# Patient Record
Sex: Male | Born: 1990 | Race: Black or African American | Hispanic: No | Marital: Single | Smoking: Former smoker
Health system: Southern US, Community
[De-identification: ages and names within clinical notes are randomized; demographics above are authoritative.]

---
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
## 2011-10-04 ENCOUNTER — Emergency Department (HOSPITAL_COMMUNITY): Payer: No Typology Code available for payment source

## 2011-10-04 ENCOUNTER — Emergency Department (HOSPITAL_COMMUNITY)
Admission: EM | Admit: 2011-10-04 | Discharge: 2011-10-04 | Disposition: A | Discharge status: Home / Self Care | Payer: No Typology Code available for payment source | Attending: Emergency Medicine | Admitting: Emergency Medicine

## 2011-10-04 ENCOUNTER — Encounter (HOSPITAL_COMMUNITY): Payer: Self-pay | Admitting: Adult Health

## 2011-10-04 DIAGNOSIS — M25519 Pain in unspecified shoulder: Secondary | ICD-10-CM | POA: Insufficient documentation

## 2011-10-04 DIAGNOSIS — Y92410 Unspecified street and highway as the place of occurrence of the external cause: Secondary | ICD-10-CM | POA: Insufficient documentation

## 2011-10-04 DIAGNOSIS — M25512 Pain in left shoulder: Secondary | ICD-10-CM

## 2011-10-04 DIAGNOSIS — T1490XA Injury, unspecified, initial encounter: Secondary | ICD-10-CM | POA: Insufficient documentation

## 2011-10-04 DIAGNOSIS — M549 Dorsalgia, unspecified: Secondary | ICD-10-CM | POA: Insufficient documentation

## 2011-10-04 MED ORDER — IBUPROFEN 800 MG PO TABS: 800.0000 mg | ORAL_TABLET | Freq: Three times a day (TID) | ORAL | Status: AC

## 2011-10-04 MED ORDER — HYDROCODONE-ACETAMINOPHEN 5-325 MG PO TABS: 1.0000 | ORAL_TABLET | ORAL | Status: AC | PRN

## 2011-10-04 MED ORDER — DIAZEPAM 5 MG PO TABS: 5.0000 mg | ORAL_TABLET | Freq: Two times a day (BID) | ORAL | Status: AC

## 2011-10-04 MED ORDER — OXYCODONE-ACETAMINOPHEN 5-325 MG PO TABS
1.0000 | ORAL_TABLET | Freq: Once | ORAL | Status: AC
  Administered 2011-10-04: 1 via ORAL
  Filled 2011-10-04: qty 1
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
## 2011-10-04 NOTE — ED Provider Notes (Signed)
Medical screening examination/treatment/procedure(s) were performed by non-physician practitioner and as supervising physician I was immediately available for consultation/collaboration.    Srihitha Tagliaferri R Agapito Hanway, MD 10/04/11 2342 
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
## 2012-11-12 ENCOUNTER — Ambulatory Visit (INDEPENDENT_AMBULATORY_CARE_PROVIDER_SITE_OTHER): Payer: BC Managed Care – PPO | Admitting: Emergency Medicine

## 2012-11-12 VITALS — BP 164/84 | HR 54 | Temp 98.5°F | Resp 16 | Ht 70.0 in | Wt 160.0 lb

## 2012-11-12 DIAGNOSIS — S335XXA Sprain of ligaments of lumbar spine, initial encounter: Secondary | ICD-10-CM

## 2012-11-12 MED ORDER — HYDROCODONE-ACETAMINOPHEN 5-325 MG PO TABS: 1.0000 | ORAL_TABLET | ORAL | Status: DC | PRN

## 2012-11-12 MED ORDER — NAPROXEN SODIUM 550 MG PO TABS: 550.0000 mg | ORAL_TABLET | Freq: Two times a day (BID) | ORAL | Status: DC

## 2012-11-12 MED ORDER — CYCLOBENZAPRINE HCL 10 MG PO TABS: 10.0000 mg | ORAL_TABLET | Freq: Three times a day (TID) | ORAL | Status: DC | PRN

## 2012-11-12 NOTE — Progress Notes (Signed)
[facility] [address], [place] [address] [phone]  Date:  11/12/2012   Name:  Brian Poole   DOB:  [birth year]   MRN:  [id number]  PCP:  No PCP Per Patient    Chief Complaint: Back Pain   History of Present Illness:  Brian Poole is a 22 y.o. very pleasant male patient who presents with the following:  Pain in low back on both sides in late afternoon.  Non radiating and no numbness tingling or weakness in legs.  No history of injury.  Works as a Adult nurse at [employer].  No improvement with OTC medcation.  Denies other complaint or health concern today.   There is no problem list on file for this patient.   History reviewed. No pertinent past medical history.  History reviewed. No pertinent past surgical history.  History  Substance Use Topics  . Smoking status: Current Every Day Smoker  . Smokeless tobacco: Not on file  . Alcohol Use: Yes    History reviewed. No pertinent family history.  No Known Allergies  Medication list has been reviewed and updated.  Current Outpatient Prescriptions on File Prior to Visit  Medication Sig Dispense Refill  . aspirin 325 MG tablet Take 325 mg by mouth daily.       No current facility-administered medications on file prior to visit.    Review of Systems:  As per HPI, otherwise negative.    Physical Examination: Filed Vitals:   11/12/12 1631  BP: 164/84  Pulse: 54  Temp: 98.5 F (36.9 C)  Resp: 16   Filed Vitals:   11/12/12 1631  Height: 5\' 10"  (1.778 m)  Weight: 160 lb (72.576 kg)   Body mass index is 22.96 kg/(m^2). Ideal Body Weight: Weight in (lb) to have BMI = 25: 173.9   GEN: WDWN, NAD, Non-toxic, Alert & Oriented x 3 HEENT: Atraumatic, Normocephalic.  Ears and Nose: No external deformity. EXTR: No clubbing/cyanosis/edema NEURO: Normal gait.  PSYCH: Normally interactive. Conversant. Not depressed or anxious appearing.  Calm demeanor.  BACK;;  Tender para vertebral  muscles bilaterally in lumbar area.  Motor and cerebellar intact  Assessment and Plan: Lumbar strain Anaprox Flexeril vicodin Local heat.  Signed,  Phillips Odor, MD

## 2012-11-12 NOTE — Patient Instructions (Addendum)

## 2012-11-14 ENCOUNTER — Ambulatory Visit (INDEPENDENT_AMBULATORY_CARE_PROVIDER_SITE_OTHER): Payer: BC Managed Care – PPO | Admitting: Internal Medicine

## 2012-11-14 ENCOUNTER — Ambulatory Visit: Payer: BC Managed Care – PPO

## 2012-11-14 VITALS — BP 144/84 | HR 63 | Temp 98.5°F | Resp 16 | Ht 68.5 in | Wt 167.0 lb

## 2012-11-14 DIAGNOSIS — M5416 Radiculopathy, lumbar region: Secondary | ICD-10-CM

## 2012-11-14 DIAGNOSIS — F17200 Nicotine dependence, unspecified, uncomplicated: Secondary | ICD-10-CM

## 2012-11-14 DIAGNOSIS — M549 Dorsalgia, unspecified: Secondary | ICD-10-CM

## 2012-11-14 DIAGNOSIS — IMO0002 Reserved for concepts with insufficient information to code with codable children: Secondary | ICD-10-CM

## 2012-11-14 MED ORDER — PREDNISONE 20 MG PO TABS: ORAL_TABLET | ORAL | Status: DC

## 2012-11-14 MED ORDER — HYDROCODONE-ACETAMINOPHEN 10-325 MG PO TABS: 1.0000 | ORAL_TABLET | Freq: Three times a day (TID) | ORAL | Status: DC | PRN
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
## 2012-11-16 DIAGNOSIS — F17200 Nicotine dependence, unspecified, uncomplicated: Secondary | ICD-10-CM | POA: Insufficient documentation

## 2014-04-04 ENCOUNTER — Ambulatory Visit (INDEPENDENT_AMBULATORY_CARE_PROVIDER_SITE_OTHER): Payer: BC Managed Care – PPO | Admitting: Family Medicine

## 2014-04-04 VITALS — BP 126/84 | HR 48 | Temp 98.2°F | Resp 16 | Ht 69.25 in | Wt 169.0 lb

## 2014-04-04 DIAGNOSIS — S335XXA Sprain of ligaments of lumbar spine, initial encounter: Secondary | ICD-10-CM

## 2014-04-04 DIAGNOSIS — M545 Low back pain, unspecified: Secondary | ICD-10-CM

## 2014-04-04 DIAGNOSIS — S39012A Strain of muscle, fascia and tendon of lower back, initial encounter: Secondary | ICD-10-CM

## 2014-04-04 MED ORDER — NAPROXEN SODIUM 550 MG PO TABS: 550.0000 mg | ORAL_TABLET | Freq: Two times a day (BID) | ORAL | Status: AC

## 2014-04-04 MED ORDER — HYDROCODONE-ACETAMINOPHEN 5-325 MG PO TABS: 1.0000 | ORAL_TABLET | ORAL | Status: DC | PRN

## 2014-04-04 MED ORDER — CYCLOBENZAPRINE HCL 10 MG PO TABS: 10.0000 mg | ORAL_TABLET | Freq: Three times a day (TID) | ORAL | Status: DC | PRN

## 2014-04-04 NOTE — Patient Instructions (Addendum)
Take the muscle relaxant, cyclobenzaprine, 1 pill 3 times daily. If it might shoot too drowsy in the daytime he can try and split it in half and take just a half  Take the naproxen anti-inflammatory medication one twice daily  Take the hydrocodone pain pills one every 4-6 hours only when needed for severe pain  Apply alternating ice and heat to your low back, or which ever one seems to make it feel best.  Avoid heavy lifting and straining  When you return to work go back with caution. If you have further problems please return

## 2014-04-04 NOTE — Progress Notes (Signed)
Subjective: 23 year old man who is here with back pain. He has had problems several times in the past. 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 This injury however occurred probably at home. He had been playing with his young child, causing him in air, and thinks he probably strained it at that time. He has been having severe low back pain, primarily on the right side, since then. It's very tight and hard to move. No major radiation of the pain.  Objective: Not tender down the spine itself. His main tenderness in this is in the right paraspinous muscles which looked bulged out a little bit. He has a little tenderness down toward the right SI joint. Straight leg raising test essentially negative, though he hurts in his back he can probably go up to 90. Deep tender reflexes are 1+, symmetrical. He moans and groans when he moves.  Assessment: Lumbar strain and pain  Plan: Muscle relaxants, anti-inflammatory, and pain medication

## 2014-10-21 ENCOUNTER — Ambulatory Visit: Payer: Self-pay

## 2014-10-21 ENCOUNTER — Emergency Department (HOSPITAL_COMMUNITY)
Admission: EM | Admit: 2014-10-21 | Discharge: 2014-10-21 | Disposition: A | Discharge status: Home / Self Care | Payer: Self-pay | Attending: Emergency Medicine | Admitting: Emergency Medicine

## 2014-10-21 ENCOUNTER — Encounter (HOSPITAL_COMMUNITY): Payer: Self-pay | Admitting: Emergency Medicine

## 2014-10-21 DIAGNOSIS — Z720 Tobacco use: Secondary | ICD-10-CM | POA: Insufficient documentation

## 2014-10-21 DIAGNOSIS — K529 Noninfective gastroenteritis and colitis, unspecified: Secondary | ICD-10-CM | POA: Insufficient documentation

## 2014-10-21 LAB — CBC WITH DIFFERENTIAL/PLATELET
BASOS ABS: 0 10*3/uL (ref 0.0–0.1)
Basophils Relative: 0 % (ref 0–1)
EOS PCT: 2 % (ref 0–5)
Eosinophils Absolute: 0.1 10*3/uL (ref 0.0–0.7)
HEMATOCRIT: 39.1 % (ref 39.0–52.0)
Hemoglobin: 14.1 g/dL (ref 13.0–17.0)
Lymphocytes Relative: 49 % — ABNORMAL HIGH (ref 12–46)
Lymphs Abs: 3.6 10*3/uL (ref 0.7–4.0)
MCH: 30.1 pg (ref 26.0–34.0)
MCHC: 36.1 g/dL — AB (ref 30.0–36.0)
MCV: 83.5 fL (ref 78.0–100.0)
MONO ABS: 0.6 10*3/uL (ref 0.1–1.0)
Monocytes Relative: 8 % (ref 3–12)
NEUTROS ABS: 3.1 10*3/uL (ref 1.7–7.7)
Neutrophils Relative %: 41 % — ABNORMAL LOW (ref 43–77)
Platelets: 225 10*3/uL (ref 150–400)
RBC: 4.68 MIL/uL (ref 4.22–5.81)
RDW: 12.3 % (ref 11.5–15.5)
WBC: 7.4 10*3/uL (ref 4.0–10.5)

## 2014-10-21 LAB — COMPREHENSIVE METABOLIC PANEL
ALT: 13 U/L (ref 0–53)
AST: 19 U/L (ref 0–37)
Albumin: 4.1 g/dL (ref 3.5–5.2)
Alkaline Phosphatase: 66 U/L (ref 39–117)
Anion gap: 8 (ref 5–15)
BUN: 6 mg/dL (ref 6–23)
CALCIUM: 9.4 mg/dL (ref 8.4–10.5)
CO2: 26 mmol/L (ref 19–32)
CREATININE: 1.01 mg/dL (ref 0.50–1.35)
Chloride: 106 mmol/L (ref 96–112)
GFR calc Af Amer: >90 mL/min (ref >90)
GLUCOSE: 126 mg/dL — AB (ref 70–99)
Potassium: 3.4 mmol/L — ABNORMAL LOW (ref 3.5–5.1)
Sodium: 140 mmol/L (ref 135–145)
TOTAL PROTEIN: 7.2 g/dL (ref 6.0–8.3)
Total Bilirubin: 0.4 mg/dL (ref 0.3–1.2)

## 2014-10-21 LAB — LIPASE, BLOOD: LIPASE: 19 U/L (ref 11–59)

## 2014-10-21 MED ORDER — ONDANSETRON 4 MG PO TBDP: ORAL_TABLET | ORAL | Status: DC

## 2014-10-21 NOTE — ED Provider Notes (Signed)
CSN: [id number]     Arrival date & time 10/21/14  1540 History   First MD Initiated Contact with Patient 10/21/14 1954     Chief Complaint  Patient presents with  . Emesis  . Diarrhea     (Consider location/radiation/quality/duration/timing/severity/associated sxs/prior Treatment) Patient is a 24 y.o. male presenting with vomiting.  Emesis Severity:  Moderate Duration:  3 days Timing:  Constant Quality:  Stomach contents Progression:  Unchanged Chronicity:  New Relieved by:  Nothing Worsened by:  Nothing tried Ineffective treatments:  None tried Associated symptoms: abdominal pain (crampy), chills, cough (very mild) and diarrhea   Associated symptoms: no fever     History reviewed. No pertinent past medical history. History reviewed. No pertinent past surgical history. History reviewed. No pertinent family history. History  Substance Use Topics  . Smoking status: Current Every Day Smoker  . Smokeless tobacco: Not on file  . Alcohol Use: Yes    Review of Systems  Constitutional: Positive for chills.  Gastrointestinal: Positive for vomiting, abdominal pain (crampy) and diarrhea.  All other systems reviewed and are negative.     Allergies  Review of patient's allergies indicates no known allergies.  Home Medications   Prior to Admission medications   Medication Sig Start Date End Date Taking? Authorizing Provider  cyclobenzaprine (FLEXERIL) 10 MG tablet Take 1 tablet (10 mg total) by mouth 3 (three) times daily as needed for muscle spasms. Patient not taking: Reported on 10/21/2014 04/04/14   Peyton Najjar, MD  HYDROcodone-acetaminophen [facility]) 5-325 MG per tablet Take 1 tablet by mouth every 4 (four) hours as needed. Patient not taking: Reported on 10/21/2014 04/04/14   Peyton Najjar, MD  naproxen sodium (ANAPROX DS) 550 MG tablet Take 1 tablet (550 mg total) by mouth 2 (two) times daily with a meal. Patient not taking: Reported on 10/21/2014 04/04/14 04/04/15  Peyton Najjar, MD  ondansetron (ZOFRAN ODT) 4 MG disintegrating tablet  ODT q4 hours prn nausea/vomit 10/21/14   Mirian Mo, MD  predniSONE (DELTASONE) 20 MG tablet 3/3/2/2/1/1 single daily dose for 6d Patient not taking: Reported on 10/21/2014 11/14/12   Tonye Pearson, MD   BP 156/98 mmHg  Pulse 53  Temp(Src) 98.5 F (36.9 C)  Resp 16  Ht  (1.778 m)  Wt 166 lb 4 oz (75.411 kg)  BMI 23.85 kg/m2  SpO2 99% Physical Exam  Constitutional: He is oriented to person, place, and time. He appears well-developed and well-nourished.  HENT:  Head: Normocephalic and atraumatic.  Eyes: Conjunctivae and EOM are normal.  Neck: Normal range of motion. Neck supple.  Cardiovascular: Normal rate, regular rhythm and normal heart sounds.   Pulmonary/Chest: Effort normal and breath sounds normal. No respiratory distress.  Abdominal: He exhibits no distension. There is tenderness in the right upper quadrant, epigastric area and left upper quadrant. There is no rebound and no guarding.  Musculoskeletal: Normal range of motion.  Neurological: He is alert and oriented to person, place, and time.  Skin: Skin is warm and dry.  Vitals reviewed.   ED Course  Procedures (including critical care time) Labs Review Labs Reviewed  CBC WITH DIFFERENTIAL/PLATELET - Abnormal; Notable for the following:    MCHC 36.1 (*)    Neutrophils Relative % 41 (*)    Lymphocytes Relative 49 (*)    All other components within normal limits  COMPREHENSIVE METABOLIC PANEL - Abnormal; Notable for the following:    Potassium 3.4 (*)    Glucose, Bld  126 (*)    All other components within normal limits  LIPASE, BLOOD    Imaging Review No results found.   EKG Interpretation None      MDM   Final diagnoses:  Gastroenteritis    24 y.o. male without pertinent PMH presents with signs/symptoms of gastroenteritis. No sick contacts. Patient has been subjectively febrile, no measured fevers. On arrival vital signs  and physical exam as above. Patient well-appearing. Has right upper quadrant, epigastric, left upper quadrant tenderness. LFTs unremarkable. Likely viral gastroenteritis.  Prescription for Zofran given. Work note given. Standard return precautions given. Discharged home in stable condition..    I have reviewed all laboratory and imaging studies if ordered as above  1. Gastroenteritis         Mirian MoMatthew Gentry, MD 10/21/14 2032

## 2014-10-21 NOTE — ED Notes (Signed)
Pt verbalized understanding of d/c instructions and has no further questions.  

## 2014-10-21 NOTE — Discharge Instructions (Signed)

## 2014-10-21 NOTE — ED Notes (Signed)
Pt c/o N/V/D x 3 days

## 2015-07-24 ENCOUNTER — Ambulatory Visit (INDEPENDENT_AMBULATORY_CARE_PROVIDER_SITE_OTHER): Payer: BLUE CROSS/BLUE SHIELD

## 2015-07-24 ENCOUNTER — Ambulatory Visit (INDEPENDENT_AMBULATORY_CARE_PROVIDER_SITE_OTHER): Payer: BLUE CROSS/BLUE SHIELD | Admitting: Family Medicine

## 2015-07-24 VITALS — BP 126/82 | HR 60 | Temp 98.3°F | Resp 16 | Ht 69.75 in | Wt 159.0 lb

## 2015-07-24 DIAGNOSIS — M545 Low back pain, unspecified: Secondary | ICD-10-CM

## 2015-07-24 DIAGNOSIS — R6883 Chills (without fever): Secondary | ICD-10-CM | POA: Diagnosis not present

## 2015-07-24 DIAGNOSIS — M62830 Muscle spasm of back: Secondary | ICD-10-CM | POA: Diagnosis not present

## 2015-07-24 DIAGNOSIS — R05 Cough: Secondary | ICD-10-CM | POA: Diagnosis not present

## 2015-07-24 DIAGNOSIS — R059 Cough, unspecified: Secondary | ICD-10-CM

## 2015-07-24 LAB — POCT CBC
GRANULOCYTE PERCENT: 61.4 % (ref 37–80)
HCT, POC: 43.4 % — AB (ref 43.5–53.7)
Hemoglobin: 15.4 g/dL (ref 14.1–18.1)
Lymph, poc: 2.6 (ref 0.6–3.4)
MCH, POC: 30.5 pg (ref 27–31.2)
MCHC: 35.3 g/dL (ref 31.8–35.4)
MCV: 86.3 fL (ref 80–97)
MID (CBC): 0.8 (ref 0–0.9)
MPV: 6.2 fL (ref 0–99.8)
PLATELET COUNT, POC: 226 10*3/uL (ref 142–424)
POC Granulocyte: 5.4 (ref 2–6.9)
POC LYMPH %: 29.9 % (ref 10–50)
POC MID %: 8.7 %M (ref 0–12)
RBC: 5.05 M/uL (ref 4.69–6.13)
RDW, POC: 12.6 %
WBC: 8.8 10*3/uL (ref 4.6–10.2)

## 2015-07-24 MED ORDER — IBUPROFEN 800 MG PO TABS: 800.0000 mg | ORAL_TABLET | Freq: Three times a day (TID) | ORAL | Status: DC | PRN

## 2015-07-24 MED ORDER — HYDROCODONE-ACETAMINOPHEN 5-325 MG PO TABS: 1.0000 | ORAL_TABLET | ORAL | Status: DC | PRN

## 2015-07-24 MED ORDER — KETOROLAC TROMETHAMINE 60 MG/2ML IM SOLN
60.0000 mg | Freq: Once | INTRAMUSCULAR | Status: AC
  Administered 2015-07-24: 60 mg via INTRAMUSCULAR

## 2015-07-24 MED ORDER — CYCLOBENZAPRINE HCL 10 MG PO TABS: 10.0000 mg | ORAL_TABLET | Freq: Three times a day (TID) | ORAL | Status: DC | PRN

## 2015-07-24 MED ORDER — BENZONATATE 100 MG PO CAPS: 100.0000 mg | ORAL_CAPSULE | Freq: Three times a day (TID) | ORAL | Status: DC | PRN

## 2015-07-24 NOTE — Patient Instructions (Signed)
Take the muscle relaxant cyclobenzaprine 1 pill 3 times daily. When you are going to be going to work you can probably only take a half of one because it will tend to make you drowsy.  Take ibuprofen 800 mg 3 times daily as needed for pain and inflammation  Take hydrocodone 5/325 one every 4-6 hours for more severe pain. This will also act as a cough suppressant  Take benzonatate cough pills one or 2 pills 3 times daily as needed for cough  Return if not improving  Plan to stay off work through Friday, and I will extend that if you let me know that I need to

## 2015-07-24 NOTE — Progress Notes (Signed)
Patient ID: Brian Poole, male    DOB: [birth year]  Age: 24 y.o. MRN: [id number]  Chief Complaint  Patient presents with  . Back Pain    since yesterday   . Cough    x 4 days   . Night Sweats  . Chills    Subjective:   Patient is had a cold and cough for 4 days. He coughed bed yesterday and his back started hurting him. He has had some night sweats and chills. His only coughing up a little yellow phlegm.    Current allergies, medications, problem list, past/family and social histories reviewed.  Objective:  BP 126/82 mmHg  Pulse 60  Temp(Src) 98.3 F (36.8 C) (Oral)  Resp 16  Ht 5' 9.75" (1.772 m)  Wt 159 lb (72.122 kg)  BMI 22.97 kg/m2  SpO2 97%  Throat clear. TMs normal. Without nodes. Chest clear to auscultation. Heart regular without murmurs. Very tender in the left side of his back  Assessment & Plan:   Assessment: 1. Cough   2. Chills   3. Left-sided low back pain without sciatica   4. Back muscle spasm       Plan: [facility] reading (PRIMARY) by  Dr. Alwyn Ren Overexpanded chest, clear.  Probably normal.  Results for orders placed or performed in visit on 07/24/15  POCT CBC  Result Value Ref Range   WBC 8.8 4.6 - 10.2 K/uL   Lymph, poc 2.6 0.6 - 3.4   POC LYMPH PERCENT 29.9 10 - 50 %L   MID (cbc) 0.8 0 - 0.9   POC MID % 8.7 0 - 12 %M   POC Granulocyte 5.4 2 - 6.9   Granulocyte percent 61.4 37 - 80 %G   RBC 5.05 4.69 - 6.13 M/uL   Hemoglobin 15.4 14.1 - 18.1 g/dL   HCT, POC 40.9 (A) 81.1 - 53.7 %   MCV 86.3 80 - 97 fL   MCH, POC 30.5 27 - 31.2 pg   MCHC 35.3 31.8 - 35.4 g/dL   RDW, POC 91.4 %   Platelet Count, POC 226 142 - 424 K/uL   MPV 6.2 0 - 99.8 fL     .Marland Kitchen    Orders Placed This Encounter  Procedures  . DG Chest 2 View    Standing Status: Future     Number of Occurrences: 1     Standing Expiration Date: 07/23/2016    Order Specific Question:  Reason for Exam (SYMPTOM  OR DIAGNOSIS REQUIRED)    Answer:  cough, chills, back pain on left     Order Specific Question:  Preferred imaging location?    Answer:  External  . POCT CBC    Meds ordered this encounter  Medications  . DISCONTD: ibuprofen (ADVIL,MOTRIN) 800 MG tablet    Sig: Take 800 mg by mouth every 8 (eight) hours as needed.  Marland Kitchen ketorolac (TORADOL) injection 60 mg    Sig:   . cyclobenzaprine (FLEXERIL) 10 MG tablet    Sig: Take 1 tablet (10 mg total) by mouth 3 (three) times daily as needed for muscle spasms.    Dispense:  30 tablet    Refill:  0  . HYDROcodone-acetaminophen (NORCO) 5-325 MG tablet    Sig: Take 1 tablet by mouth every 4 (four) hours as needed.    Dispense:  200 tablet    Refill:  0  . ibuprofen (ADVIL,MOTRIN) 800 MG tablet    Sig: Take 1 tablet (800 mg total) by mouth every  8 (eight) hours as needed.    Dispense:  30 tablet    Refill:  1  . benzonatate (TESSALON) 100 MG capsule    Sig: Take 1-2 capsules (100-200 mg total) by mouth 3 (three) times daily as needed.    Dispense:  30 capsule    Refill:  0         Patient Instructions  Take the muscle relaxant cyclobenzaprine 1 pill 3 times daily. When you are going to be going to work you can probably only take a half of one because it will tend to make you drowsy.  Take ibuprofen 800 mg 3 times daily as needed for pain and inflammation  Take hydrocodone 5/325 one every 4-6 hours for more severe pain. This will also act as a cough suppressant  Take benzonatate cough pills one or 2 pills 3 times daily as needed for cough  Return if not improving  Plan to stay off work through Friday, and I will extend that if you let me know that I need to       Return if symptoms worsen or fail to improve.   HOPPER,DAVID, MD 07/24/2015

## 2015-11-27 ENCOUNTER — Ambulatory Visit (INDEPENDENT_AMBULATORY_CARE_PROVIDER_SITE_OTHER): Payer: BLUE CROSS/BLUE SHIELD | Admitting: Family Medicine

## 2015-11-27 VITALS — BP 144/86 | HR 58 | Temp 97.9°F | Resp 16 | Ht 69.75 in | Wt 175.0 lb

## 2015-11-27 DIAGNOSIS — R05 Cough: Secondary | ICD-10-CM

## 2015-11-27 DIAGNOSIS — M545 Low back pain, unspecified: Secondary | ICD-10-CM

## 2015-11-27 DIAGNOSIS — M62830 Muscle spasm of back: Secondary | ICD-10-CM

## 2015-11-27 DIAGNOSIS — R059 Cough, unspecified: Secondary | ICD-10-CM

## 2015-11-27 MED ORDER — PREDNISONE 20 MG PO TABS: ORAL_TABLET | ORAL | Status: DC

## 2015-11-27 MED ORDER — HYDROCODONE-ACETAMINOPHEN 5-325 MG PO TABS: 1.0000 | ORAL_TABLET | ORAL | Status: DC | PRN

## 2015-11-27 MED ORDER — HYDROCODONE-ACETAMINOPHEN 5-325 MG PO TABS: 1.0000 | ORAL_TABLET | Freq: Four times a day (QID) | ORAL | Status: DC | PRN

## 2015-11-27 NOTE — Addendum Note (Signed)
Addended by: Elvina SidleLAUENSTEIN, Babe Anthis on: 11/27/2015 03:57 PM   Modules accepted: Orders

## 2015-11-27 NOTE — Progress Notes (Addendum)
Expand All Collapse All   Subjective: 25 year old man who is here with back pain. He has had problems several times in the past. 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             25 yo [employer] worker who has 9 days of lower back pain, worse when getting out of a chair.  His pain began after lifting a 110 lb. Lawn mower off the [employer] line.  NO fever, problems with urination, radiation of the pain  Objective: BP 144/86 mmHg  Pulse 58  Temp(Src) 97.9 F (36.6 C)  Resp 16  Ht 5' 9.75" (1.772 m)  Wt 175 lb (79.379 kg)  BMI 25.28 kg/m2  SpO2 98% SLR:  Negative Good KJR and AJR Tender posterior pelvic crest bilaterally.  Cough  Left-sided low back pain without sciatica  Back muscle spasm - Plan: predniSONE (DELTASONE) 20 MG tablet, HYDROcodone-acetaminophen (NORCO) 5-325 MG tablet, DISCONTINUED: HYDROcodone-acetaminophen (NORCO) 5-325 MG tablet  Elvina SidleKurt Bronc Brosseau, MD

## 2015-11-27 NOTE — Patient Instructions (Addendum)
Return if unable to work on Sunday.

## 2016-03-24 ENCOUNTER — Encounter (HOSPITAL_COMMUNITY): Payer: Self-pay | Admitting: Emergency Medicine

## 2016-03-24 ENCOUNTER — Emergency Department (HOSPITAL_COMMUNITY)
Admission: EM | Admit: 2016-03-24 | Discharge: 2016-03-24 | Disposition: A | Discharge status: Home / Self Care | Payer: 59 | Attending: Emergency Medicine | Admitting: Emergency Medicine

## 2016-03-24 DIAGNOSIS — S39012A Strain of muscle, fascia and tendon of lower back, initial encounter: Secondary | ICD-10-CM | POA: Diagnosis not present

## 2016-03-24 DIAGNOSIS — Y929 Unspecified place or not applicable: Secondary | ICD-10-CM | POA: Diagnosis not present

## 2016-03-24 DIAGNOSIS — Y939 Activity, unspecified: Secondary | ICD-10-CM | POA: Diagnosis not present

## 2016-03-24 DIAGNOSIS — S3992XA Unspecified injury of lower back, initial encounter: Secondary | ICD-10-CM | POA: Diagnosis present

## 2016-03-24 DIAGNOSIS — F17200 Nicotine dependence, unspecified, uncomplicated: Secondary | ICD-10-CM | POA: Diagnosis not present

## 2016-03-24 DIAGNOSIS — X500XXA Overexertion from strenuous movement or load, initial encounter: Secondary | ICD-10-CM | POA: Diagnosis not present

## 2016-03-24 DIAGNOSIS — Y990 Civilian activity done for income or pay: Secondary | ICD-10-CM | POA: Diagnosis not present

## 2016-03-24 MED ORDER — NAPROXEN 500 MG PO TABS: 500.0000 mg | ORAL_TABLET | Freq: Two times a day (BID) | ORAL | 0 refills | Status: DC

## 2016-03-24 MED ORDER — METHOCARBAMOL 500 MG PO TABS: 1000.0000 mg | ORAL_TABLET | Freq: Four times a day (QID) | ORAL | 0 refills | Status: DC

## 2016-03-24 MED ORDER — KETOROLAC TROMETHAMINE 60 MG/2ML IM SOLN
60.0000 mg | Freq: Once | INTRAMUSCULAR | Status: AC
  Administered 2016-03-24: 60 mg via INTRAMUSCULAR
  Filled 2016-03-24: qty 2

## 2016-03-24 MED ORDER — METHOCARBAMOL 500 MG PO TABS
1000.0000 mg | ORAL_TABLET | Freq: Once | ORAL | Status: AC
  Administered 2016-03-24: 1000 mg via ORAL
  Filled 2016-03-24: qty 2

## 2016-03-24 NOTE — Discharge Instructions (Signed)
Please read and follow all provided instructions.  Your diagnoses today include:  1. Lumbar strain, initial encounter     Tests performed today include:  Vital signs - see below for your results today  Medications prescribed:   Robaxin (methocarbamol) - muscle relaxer medication  DO NOT drive or perform any activities that require you to be awake and alert because this medicine can make you drowsy.    Naproxen - anti-inflammatory pain medication  Do not exceed 500mg  naproxen every 12 hours, take with food  You have been prescribed an anti-inflammatory medication or NSAID. Take with food. Take smallest effective dose for the shortest duration needed for your pain. Stop taking if you experience stomach pain or vomiting.   Take any prescribed medications only as directed.  Home care instructions:   Follow any educational materials contained in this packet  Please rest, use ice or heat on your back for the next several days  Do not lift, push, pull anything more than 10 pounds for the next week  Follow-up instructions: Please follow-up with your primary care provider in the next 1 week for further evaluation of your symptoms.   Return instructions:  SEEK IMMEDIATE MEDICAL ATTENTION IF YOU HAVE:  New numbness, tingling, weakness, or problem with the use of your arms or legs  Severe back pain not relieved with medications  Loss control of your bowels or bladder  Increasing pain in any areas of the body (such as chest or abdominal pain)  Shortness of breath, dizziness, or fainting.   Worsening nausea (feeling sick to your stomach), vomiting, fever, or sweats  Any other emergent concerns regarding your health   Additional Information:  Your vital signs today were: BP 134/74 (BP Location: Left Arm)    Pulse 68    Temp 98.4 F (36.9 C) (Oral)    Resp 16    Ht 5\' 10"  (1.778 m)    Wt 76.2 kg    SpO2 100%    BMI 24.11 kg/m  If your blood pressure (BP) was elevated above  135/85 this visit, please have this repeated by your doctor within one month. --------------

## 2016-03-24 NOTE — ED Triage Notes (Signed)
Pt c/o lower back pain.  Denies radiation.  Sts it started 2 days ago.  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

## 2016-03-24 NOTE — ED Provider Notes (Signed)
[facility]-EMERGENCY DEPT Provider Note   CSN: [id number] Arrival date & time: 03/24/16  0036     History   Chief Complaint Chief Complaint  Patient presents with  . Back Pain    HPI Brian Poole is a 25 y.o. male.  Patient with history of low back pain, job or he does heavy lifting -- presents with 2 days of lower back pain. Patient became worse today when he was at work and lifted a heavy object. Pain does not shoot down his legs. Patient denies warning symptoms of back pain including: fecal incontinence, urinary retention or overflow incontinence, night sweats, waking from sleep with back pain, unexplained fevers or weight loss, h/o cancer, IVDU, recent trauma. Took ibuprofen last night but no other treatments other than soaking in a warm bathtub.      History reviewed. No pertinent past medical history.  Patient Active Problem List   Diagnosis Date Noted  . Nicotine addiction 11/16/2012    History reviewed. No pertinent surgical history.     Home Medications    Prior to Admission medications   Medication Sig Start Date End Date Taking? Authorizing Provider  HYDROcodone-acetaminophen (NORCO) 5-325 MG tablet Take 1 tablet by mouth every 6 (six) hours as needed for moderate pain. 11/27/15   Elvina SidleKurt Lauenstein, MD  methocarbamol (ROBAXIN) 500 MG tablet Take 2 tablets (1,000 mg total) by mouth 4 (four) times daily. 03/24/16   Renne CriglerJoshua Renata Gambino, PA-C  naproxen (NAPROSYN) 500 MG tablet Take 1 tablet (500 mg total) by mouth 2 (two) times daily. 03/24/16   Renne CriglerJoshua Neyland Pettengill, PA-C  predniSONE (DELTASONE) 20 MG tablet 3/3/2/2/1/1 single daily dose for 6d 11/27/15   Elvina SidleKurt Lauenstein, MD    Family History History reviewed. No pertinent family history.  Social History Social History  Substance Use Topics  . Smoking status: Current Every Day Smoker    Packs/day: 0.50  . Smokeless tobacco: Never Used  . Alcohol use Yes     Allergies   Review of patient's allergies indicates no known  allergies.   Review of Systems Review of Systems  Constitutional: Negative for fever and unexpected weight change.  Gastrointestinal: Negative for constipation.       Neg for fecal incontinence  Genitourinary: Negative for difficulty urinating, flank pain and hematuria.       Negative for urinary incontinence or retention  Musculoskeletal: Positive for back pain.  Neurological: Negative for weakness and numbness.       Negative for saddle paresthesias      Physical Exam Updated Vital Signs BP 134/74 (BP Location: Left Arm)   Pulse 68   Temp 98.4 F (36.9 C) (Oral)   Resp 16   Ht 5\' 10"  (1.778 m)   Wt 76.2 kg   SpO2 100%   BMI 24.11 kg/m   Physical Exam  Constitutional: He appears well-developed and well-nourished.  HENT:  Head: Normocephalic and atraumatic.  Eyes: Conjunctivae are normal.  Neck: Normal range of motion.  Abdominal: Soft. There is no tenderness. There is no CVA tenderness.  Musculoskeletal: Normal range of motion.       Cervical back: He exhibits normal range of motion, no tenderness and no bony tenderness.       Thoracic back: He exhibits normal range of motion, no tenderness and no bony tenderness.       Lumbar back: He exhibits tenderness. He exhibits normal range of motion and no bony tenderness.  No step-off noted with palpation of spine.   Neurological: He  is alert. He has normal reflexes. No sensory deficit. He exhibits normal muscle tone. Coordination and gait normal.  5/5 strength in entire lower extremities bilaterally. No sensation deficit. Slow but normal gait. He can stand on his tiptoes.  Skin: Skin is warm and dry.  Psychiatric: He has a normal mood and affect.  Nursing note and vitals reviewed.    ED Treatments / Results  Labs (all labs ordered are listed, but only abnormal results are displayed) Labs Reviewed - No data to display  EKG  EKG Interpretation None       Radiology No results found.  Procedures Procedures  (including critical care time)  Medications Ordered in ED Medications  ketorolac (TORADOL) injection 60 mg (not administered)  methocarbamol (ROBAXIN) tablet 1,000 mg (not administered)     Initial Impression / Assessment and Plan / ED Course  I have reviewed the triage vital signs and the nursing notes.  1:15 AM Patient seen and examined. Medications ordered.   Vital signs reviewed and are as follows: Vitals:   03/24/16 0047  BP: 134/74  Pulse: 68  Resp: 16  Temp: 98.4 F (36.9 C)    No red flag s/s of low back pain. Patient was counseled on back pain precautions and told to do activity as tolerated but do not lift, push, or pull heavy objects more than 10 pounds for the next week.  Patient counseled to use ice or heat on back for no longer than 15 minutes every hour.   Patient prescribed muscle relaxer and counseled on proper use of muscle relaxant medication.    Urged patient not to drink alcohol, drive, or perform any other activities that requires focus while taking either of these medications.  Patient urged to follow-up with PCP if pain does not improve with treatment and rest or if pain becomes recurrent. Urged to return with worsening severe pain, loss of bowel or bladder control, trouble walking.   The patient verbalizes understanding and agrees with the plan.    Final Clinical Impressions(s) / ED Diagnoses   Final diagnoses:  Lumbar strain, initial encounter   Patient with back pain. No neurological deficits. Patient is ambulatory. No warning symptoms of back pain including: fecal incontinence, urinary retention or overflow incontinence, night sweats, waking from sleep with back pain, unexplained fevers or weight loss, h/o cancer, IVDU, recent trauma. No concern for cauda equina, epidural abscess, or other serious cause of back pain. Conservative measures such as rest, ice/heat and pain medicine indicated with PCP follow-up if no improvement with conservative  management.    New Prescriptions New Prescriptions   METHOCARBAMOL (ROBAXIN) 500 MG TABLET    Take 2 tablets (1,000 mg total) by mouth 4 (four) times daily.   NAPROXEN (NAPROSYN) 500 MG TABLET    Take 1 tablet (500 mg total) by mouth 2 (two) times daily.     Renne CriglerJoshua Madonna Flegal, PA-C 03/24/16 [id number]    Shon Batonourtney F Horton, MD 03/24/16 [id number]

## 2018-12-28 ENCOUNTER — Emergency Department (HOSPITAL_COMMUNITY): Payer: No Typology Code available for payment source

## 2018-12-28 ENCOUNTER — Emergency Department (HOSPITAL_COMMUNITY)
Admission: EM | Admit: 2018-12-28 | Discharge: 2018-12-28 | Disposition: A | Discharge status: Home / Self Care | Payer: No Typology Code available for payment source | Attending: Emergency Medicine | Admitting: Emergency Medicine

## 2018-12-28 ENCOUNTER — Other Ambulatory Visit: Payer: Self-pay

## 2018-12-28 ENCOUNTER — Encounter (HOSPITAL_COMMUNITY): Payer: Self-pay | Admitting: Emergency Medicine

## 2018-12-28 DIAGNOSIS — R410 Disorientation, unspecified: Secondary | ICD-10-CM | POA: Insufficient documentation

## 2018-12-28 DIAGNOSIS — Y9389 Activity, other specified: Secondary | ICD-10-CM | POA: Diagnosis not present

## 2018-12-28 DIAGNOSIS — F17210 Nicotine dependence, cigarettes, uncomplicated: Secondary | ICD-10-CM | POA: Insufficient documentation

## 2018-12-28 DIAGNOSIS — Y998 Other external cause status: Secondary | ICD-10-CM | POA: Diagnosis not present

## 2018-12-28 DIAGNOSIS — S199XXA Unspecified injury of neck, initial encounter: Secondary | ICD-10-CM | POA: Diagnosis present

## 2018-12-28 DIAGNOSIS — Y929 Unspecified place or not applicable: Secondary | ICD-10-CM | POA: Diagnosis not present

## 2018-12-28 DIAGNOSIS — S161XXA Strain of muscle, fascia and tendon at neck level, initial encounter: Secondary | ICD-10-CM | POA: Insufficient documentation

## 2018-12-28 DIAGNOSIS — S39012A Strain of muscle, fascia and tendon of lower back, initial encounter: Secondary | ICD-10-CM | POA: Insufficient documentation

## 2018-12-28 LAB — RAPID URINE DRUG SCREEN, HOSP PERFORMED
Amphetamines: NOT DETECTED
Barbiturates: NOT DETECTED
Benzodiazepines: NOT DETECTED
Cocaine: NOT DETECTED
Opiates: NOT DETECTED
Tetrahydrocannabinol: POSITIVE — AB

## 2018-12-28 LAB — CBC WITH DIFFERENTIAL/PLATELET
Abs Immature Granulocytes: 0.02 10*3/uL (ref 0.00–0.07)
Basophils Absolute: 0.1 10*3/uL (ref 0.0–0.1)
Basophils Relative: 1 %
Eosinophils Absolute: 0.2 10*3/uL (ref 0.0–0.5)
Eosinophils Relative: 2 %
HCT: 38 % — ABNORMAL LOW (ref 39.0–52.0)
Hemoglobin: 13.5 g/dL (ref 13.0–17.0)
Immature Granulocytes: 0 %
Lymphocytes Relative: 40 %
Lymphs Abs: 3.4 10*3/uL (ref 0.7–4.0)
MCH: 30.1 pg (ref 26.0–34.0)
MCHC: 35.5 g/dL (ref 30.0–36.0)
MCV: 84.8 fL (ref 80.0–100.0)
Monocytes Absolute: 0.6 10*3/uL (ref 0.1–1.0)
Monocytes Relative: 7 %
Neutro Abs: 4.3 10*3/uL (ref 1.7–7.7)
Neutrophils Relative %: 50 %
Platelets: 260 10*3/uL (ref 150–400)
RBC: 4.48 MIL/uL (ref 4.22–5.81)
RDW: 12.6 % (ref 11.5–15.5)
WBC: 8.5 10*3/uL (ref 4.0–10.5)
nRBC: 0 % (ref 0.0–0.2)

## 2018-12-28 LAB — BASIC METABOLIC PANEL
Anion gap: 9 (ref 5–15)
BUN: 6 mg/dL (ref 6–20)
CO2: 24 mmol/L (ref 22–32)
Calcium: 9.4 mg/dL (ref 8.9–10.3)
Chloride: 105 mmol/L (ref 98–111)
Creatinine, Ser: 1.01 mg/dL (ref 0.61–1.24)
GFR calc Af Amer: >60 mL/min (ref >60)
GFR calc non Af Amer: >60 mL/min (ref >60)
Glucose, Bld: 96 mg/dL (ref 70–99)
Potassium: 4.3 mmol/L (ref 3.5–5.1)
Sodium: 138 mmol/L (ref 135–145)

## 2018-12-28 LAB — URINALYSIS, ROUTINE W REFLEX MICROSCOPIC
Bilirubin Urine: NEGATIVE
Glucose, UA: NEGATIVE mg/dL
Hgb urine dipstick: NEGATIVE
Ketones, ur: NEGATIVE mg/dL
Leukocytes,Ua: NEGATIVE
Nitrite: NEGATIVE
Protein, ur: NEGATIVE mg/dL
Specific Gravity, Urine: 1.025 (ref 1.005–1.030)
pH: 6 (ref 5.0–8.0)

## 2018-12-28 LAB — ETHANOL: Alcohol, Ethyl (B): <10 mg/dL (ref <10)

## 2018-12-28 MED ORDER — ONDANSETRON HCL 4 MG/2ML IJ SOLN
4.0000 mg | Freq: Once | INTRAMUSCULAR | Status: AC
  Administered 2018-12-28: 4 mg via INTRAVENOUS
  Filled 2018-12-28: qty 2

## 2018-12-28 MED ORDER — HYDROCODONE-ACETAMINOPHEN 5-325 MG PO TABS: 1.0000 | ORAL_TABLET | ORAL | 0 refills | Status: DC | PRN

## 2018-12-28 MED ORDER — MORPHINE SULFATE (PF) 4 MG/ML IV SOLN
4.0000 mg | Freq: Once | INTRAVENOUS | Status: AC
  Administered 2018-12-28: 19:00:00 4 mg via INTRAVENOUS
  Filled 2018-12-28: qty 1

## 2018-12-28 MED ORDER — IBUPROFEN 600 MG PO TABS: 600.0000 mg | ORAL_TABLET | Freq: Four times a day (QID) | ORAL | 0 refills | Status: DC | PRN
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
## 2018-12-28 NOTE — ED Notes (Signed)
Pt given urinal.

## 2019-08-26 ENCOUNTER — Ambulatory Visit (HOSPITAL_COMMUNITY)
Admission: EM | Admit: 2019-08-26 | Discharge: 2019-08-26 | Disposition: A | Discharge status: Home / Self Care | Payer: Managed Care, Other (non HMO) | Attending: Physician Assistant | Admitting: Physician Assistant

## 2019-08-26 ENCOUNTER — Other Ambulatory Visit: Payer: Self-pay

## 2019-08-26 ENCOUNTER — Encounter (HOSPITAL_COMMUNITY): Payer: Self-pay

## 2019-08-26 DIAGNOSIS — R030 Elevated blood-pressure reading, without diagnosis of hypertension: Secondary | ICD-10-CM | POA: Insufficient documentation

## 2019-08-26 DIAGNOSIS — B349 Viral infection, unspecified: Secondary | ICD-10-CM | POA: Diagnosis not present

## 2019-08-26 DIAGNOSIS — U071 COVID-19: Secondary | ICD-10-CM | POA: Diagnosis not present

## 2019-08-26 DIAGNOSIS — Z87891 Personal history of nicotine dependence: Secondary | ICD-10-CM | POA: Insufficient documentation

## 2019-08-26 DIAGNOSIS — M7521 Bicipital tendinitis, right shoulder: Secondary | ICD-10-CM | POA: Diagnosis not present

## 2019-08-26 DIAGNOSIS — Z833 Family history of diabetes mellitus: Secondary | ICD-10-CM | POA: Diagnosis not present

## 2019-08-26 DIAGNOSIS — Z8249 Family history of ischemic heart disease and other diseases of the circulatory system: Secondary | ICD-10-CM | POA: Insufficient documentation

## 2019-08-26 MED ORDER — ONDANSETRON HCL 4 MG PO TABS: 4.0000 mg | ORAL_TABLET | Freq: Three times a day (TID) | ORAL | 0 refills | Status: DC | PRN

## 2019-08-26 MED ORDER — IBUPROFEN 800 MG PO TABS: 800.0000 mg | ORAL_TABLET | Freq: Three times a day (TID) | ORAL | 0 refills | Status: DC

## 2019-08-26 NOTE — Discharge Instructions (Signed)
Take the ibuprofen 3 times a day for 3-4 days and then as needed.   Ice the shoulder after work and modify activities that hurt the shoulder as much as possible  Use the exercises attached  Take the zofran every 8 hours as needed for nausea, drink plenty of water and small meals  If your Covid-19 test is positive, you will receive a phone call from [facility] regarding your results. Negative test results are not called. Both positive and negative results area always visible on MyChart. If you do not have a MyChart account, sign up instructions are in your discharge papers.   Persons who are directed to care for themselves at home may discontinue isolation under the following conditions:   At least 10 days have passed since symptom onset and  At least 24 hours have passed without running a fever (this means without the use of fever-reducing medications) and  Other symptoms have improved.  Persons infected with COVID-19 who never develop symptoms may discontinue isolation and other precautions 10 days after the date of their first positive COVID-19 test.

## 2019-08-26 NOTE — ED Provider Notes (Signed)
[facility]    CSN: [id number] Arrival date & time: 08/26/19  1629      History   Chief Complaint Chief Complaint  Patient presents with  . Shoulder Pain    right  . Chills  . Fever    HPI Brian Poole is a 29 y.o. male.   Patient reports to urgent care today for  Right shoulder pain for 1 month as well as fever, chills, nausea and vomiting starting yesterday. He denies injury to the shoulder and this gradually began to hurt more and more. He states pain is in the front of his shoulder and hurts to raise his arm. This has been worsening . He has tried not tried any medications for his shoulder. 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  As for his fever, chills and nausea symptoms. These started yesterday. Have improved somewhat since then but he remains nauseous. He took childrens motrin. Denies cough, congestion, shortness of breath.        History reviewed. No pertinent past medical history.  Patient Active Problem List   Diagnosis Date Noted  . Nicotine addiction 11/16/2012    History reviewed. No pertinent surgical history.     Home Medications    Prior to Admission medications   Medication Sig Start Date End Date Taking? Authorizing Provider  HYDROcodone-acetaminophen (NORCO/VICODIN) 5-325 MG tablet Take 1 tablet by mouth every 4 (four) hours as needed. 12/28/18   Jacalyn Lefevre, MD  ibuprofen (ADVIL) 800 MG tablet Take 1 tablet (800 mg total) by mouth 3 (three) times daily. 08/26/19   Zavian Slowey, Veryl Speak, PA-C  methocarbamol (ROBAXIN) 500 MG tablet Take 2 tablets (1,000 mg total) by mouth 4 (four) times daily. 03/24/16   Renne Crigler, PA-C  naproxen (NAPROSYN) 500 MG tablet Take 1 tablet (500 mg total) by mouth 2 (two) times daily. 03/24/16   Renne Crigler, PA-C  ondansetron (ZOFRAN) 4 MG tablet Take 1 tablet (4 mg total) by mouth every 8 (eight) hours as needed for nausea or vomiting. 08/26/19   Florence Yeung, Veryl Speak, PA-C  predniSONE (DELTASONE) 20 MG tablet 3/3/2/2/1/1 single daily dose for 6d 11/27/15   Elvina Sidle, MD    Family History Family History  Problem Relation Age of Onset  . Hypertension Mother   . Diabetes Mother   . Healthy Father     Social History Social History   Tobacco Use  . Smoking status: Former Smoker    Packs/day: 0.50    Types: Cigarettes    Quit date: 04/25/2018    Years since quitting: 1.3  . Smokeless tobacco: Never Used  Substance Use Topics  . Alcohol use: Yes  . Drug use: No     Allergies   Patient has no known allergies.   Review of Systems Review of Systems  Constitutional: Positive for chills and fever.  HENT: Negative for congestion, ear pain, rhinorrhea, sinus pressure, sinus pain and sore throat.   Eyes: Negative for visual disturbance.  Respiratory: Negative for cough and shortness of breath.   Cardiovascular: Negative for chest pain and palpitations.  Gastrointestinal: Positive for nausea and vomiting. Negative for abdominal pain.  Genitourinary: Negative for dysuria and hematuria.  Musculoskeletal: Positive for arthralgias. Negative for back pain, joint swelling and myalgias.  Skin: Negative for color change and rash.  Neurological: Positive for headaches.  All other systems reviewed and are negative.    Physical Exam Triage Vital Signs ED  Triage Vitals [08/26/19 1704]  Enc Vitals Group     BP      Pulse      Resp      Temp      Temp src      SpO2      Weight      Height      Head Circumference      Peak Flow      Pain Score 5     Pain Loc      Pain Edu?      Excl. in GC?    No data found.  Updated Vital Signs BP (!) 165/92 (BP Location: Left Arm)   Pulse 65   Temp 98.4 F (36.9 C) (Oral)   Resp 16   SpO2 99%   Visual Acuity Right Eye Distance:   Left Eye Distance:   Bilateral Distance:    Right Eye Near:   Left Eye Near:    Bilateral Near:     Physical Exam Vitals and nursing note reviewed.    Constitutional:      General: He is not in acute distress.    Appearance: He is well-developed. He is not ill-appearing.  HENT:     Head: Normocephalic and atraumatic.     Mouth/Throat:     Mouth: Mucous membranes are moist.     Pharynx: Oropharynx is clear.  Eyes:     General: No scleral icterus.    Extraocular Movements: Extraocular movements intact.     Conjunctiva/sclera: Conjunctivae normal.     Pupils: Pupils are equal, round, and reactive to light.  Cardiovascular:     Rate and Rhythm: Normal rate and regular rhythm.     Heart sounds: Normal heart sounds. No murmur.  Pulmonary:     Effort: Pulmonary effort is normal. No respiratory distress.     Breath sounds: Normal breath sounds. No wheezing, rhonchi or rales.  Abdominal:     Palpations: Abdomen is soft.     Tenderness: There is no abdominal tenderness. There is no right CVA tenderness or left CVA tenderness.  Musculoskeletal:     Right shoulder: Tenderness (over bicipital groove) present. No bony tenderness. Decreased range of motion (secondary to pain with active, Passive ROM  intact).     Left shoulder: Normal.     Cervical back: Neck supple.     Comments: Speeds and yeargasons positive.   Skin:    General: Skin is warm and dry.  Neurological:     General: No focal deficit present.     Mental Status: He is alert and oriented to person, place, and time.     Sensory: No sensory deficit.     Motor: No weakness.  Psychiatric:        Mood and Affect: Mood normal.        Behavior: Behavior normal.        Thought Content: Thought content normal.        Judgment: Judgment normal.      UC Treatments / Results  Labs (all labs ordered are listed, but only abnormal results are displayed) Labs Reviewed  NOVEL CORONAVIRUS, NAA (HOSP ORDER, SEND-OUT TO REF LAB; TAT 18-24 HRS)    EKG   Radiology No results found.  Procedures Procedures (including critical care time)  Medications Ordered in UC Medications - No  data to display  Initial Impression / Assessment and Plan / UC Course  I have reviewed the triage vital signs and the nursing notes.  Pertinent labs & imaging results that were available during my care of the patient were reviewed by me and considered in my medical decision making (see chart for details).     #Viral illness -COVID PCR sent. Symptomatic treatments for nausea and body ache. Precautions discussed  #Biceps tendonitis - Exam consistent with this. Scheduled ibuprofen and activity modification. Ice and exercises recommended.  #elevated blood pressure -  Re-evaluation in 2 weeks.   Final Clinical Impressions(s) / UC Diagnoses   Final diagnoses:  Viral illness  Biceps tendonitis on right  Elevated blood pressure reading     Discharge Instructions     Take the ibuprofen 3 times a day for 3-4 days and then as needed.   Ice the shoulder after work and modify activities that hurt the shoulder as much as possible  Use the exercises attached  Take the zofran every 8 hours as needed for nausea, drink plenty of water and small meals  If your Covid-19 test is positive, you will receive a phone call from [facility] regarding your results. Negative test results are not called. Both positive and negative results area always visible on MyChart. If you do not have a MyChart account, sign up instructions are in your discharge papers.   Persons who are directed to care for themselves at home may discontinue isolation under the following conditions:  . At least 10 days have passed since symptom onset and . At least 24 hours have passed without running a fever (this means without the use of fever-reducing medications) and . Other symptoms have improved.  Persons infected with COVID-19 who never develop symptoms may discontinue isolation and other precautions 10 days after the date of their first positive COVID-19 test.      ED Prescriptions    Medication Sig Dispense  Auth. Provider   ibuprofen (ADVIL) 800 MG tablet Take 1 tablet (800 mg total) by mouth 3 (three) times daily. 21 tablet Aliece Honold, Marguerita Beards, PA-C   ondansetron (ZOFRAN) 4 MG tablet Take 1 tablet (4 mg total) by mouth every 8 (eight) hours as needed for nausea or vomiting. 4 tablet Latessa Tillis, Marguerita Beards, PA-C     PDMP not reviewed this encounter.   Purnell Shoemaker, PA-C 08/26/19 2001

## 2019-08-26 NOTE — ED Triage Notes (Signed)
Patient presents to Urgent Care with complaints of right shoulder pain x1 month and fever/chills since two days ago. Patient reports he has been taking his kid's motrin for the fever.

## 2019-08-28 ENCOUNTER — Telehealth (HOSPITAL_COMMUNITY): Payer: Self-pay | Admitting: Emergency Medicine

## 2019-08-28 LAB — NOVEL CORONAVIRUS, NAA (HOSP ORDER, SEND-OUT TO REF LAB; TAT 18-24 HRS): SARS-CoV-2, NAA: DETECTED — AB

## 2019-08-28 NOTE — Telephone Encounter (Signed)
Your test for COVID-19 was positive, meaning that you were infected with the novel coronavirus and could give the germ to others.  Please continue isolation at home for at least 10 days since the start of your symptoms. If you do not have symptoms, please isolate at home for 10 days from the day you were tested. Once you complete your 10 day quarantine, you may return to normal activities as long as you've not had a fever for over 24 hours(without taking fever reducing medicine) and your symptoms are improving. Please continue good preventive care measures, including:  frequent hand-washing, avoid touching your face, cover coughs/sneezes, stay out of crowds and keep a 6 foot distance from others.  Go to the nearest hospital emergency room if fever/cough/breathlessness are severe or illness seems like a threat to life.  Patient contacted by phone and made aware of    results. Pt verbalized understanding and had all questions answered.  Quarantine ends Jan 28th.

## 2019-09-07 ENCOUNTER — Ambulatory Visit: Payer: Managed Care, Other (non HMO) | Admitting: Family Medicine

## 2020-08-20 ENCOUNTER — Other Ambulatory Visit: Payer: Managed Care, Other (non HMO)

## 2020-08-20 DIAGNOSIS — Z20822 Contact with and (suspected) exposure to covid-19: Secondary | ICD-10-CM

## 2020-08-23 LAB — NOVEL CORONAVIRUS, NAA: SARS-CoV-2, NAA: DETECTED — AB
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
## 2023-04-01 ENCOUNTER — Telehealth: Payer: Self-pay | Admitting: Physician Assistant

## 2023-04-01 ENCOUNTER — Ambulatory Visit (HOSPITAL_COMMUNITY)
Admission: EM | Admit: 2023-04-01 | Discharge: 2023-04-01 | Disposition: A | Discharge status: Home / Self Care | Payer: 59 | Attending: Physician Assistant | Admitting: Physician Assistant

## 2023-04-01 ENCOUNTER — Ambulatory Visit (HOSPITAL_COMMUNITY): Payer: Self-pay

## 2023-04-01 ENCOUNTER — Encounter (HOSPITAL_COMMUNITY): Payer: Self-pay

## 2023-04-01 DIAGNOSIS — R051 Acute cough: Secondary | ICD-10-CM | POA: Diagnosis present

## 2023-04-01 DIAGNOSIS — Z1152 Encounter for screening for COVID-19: Secondary | ICD-10-CM | POA: Diagnosis not present

## 2023-04-01 DIAGNOSIS — Z202 Contact with and (suspected) exposure to infections with a predominantly sexual mode of transmission: Secondary | ICD-10-CM | POA: Diagnosis not present

## 2023-04-01 DIAGNOSIS — Z87891 Personal history of nicotine dependence: Secondary | ICD-10-CM | POA: Diagnosis not present

## 2023-04-01 DIAGNOSIS — J069 Acute upper respiratory infection, unspecified: Secondary | ICD-10-CM | POA: Diagnosis not present

## 2023-04-01 DIAGNOSIS — Z8616 Personal history of COVID-19: Secondary | ICD-10-CM | POA: Insufficient documentation

## 2023-04-01 DIAGNOSIS — R300 Dysuria: Secondary | ICD-10-CM

## 2023-04-01 DIAGNOSIS — N4889 Other specified disorders of penis: Secondary | ICD-10-CM | POA: Diagnosis not present

## 2023-04-01 DIAGNOSIS — I10 Essential (primary) hypertension: Secondary | ICD-10-CM | POA: Insufficient documentation

## 2023-04-01 LAB — HIV ANTIBODY (ROUTINE TESTING W REFLEX): HIV Screen 4th Generation wRfx: NONREACTIVE

## 2023-04-01 LAB — POCT RAPID STREP A (OFFICE): Rapid Strep A Screen: NEGATIVE

## 2023-04-01 LAB — RPR: RPR Ser Ql: NONREACTIVE

## 2023-04-01 LAB — HEPATITIS C ANTIBODY: HCV Ab: NONREACTIVE

## 2023-04-01 MED ORDER — AMLODIPINE BESYLATE 10 MG PO TABS: 10.0000 mg | ORAL_TABLET | Freq: Every day | ORAL | 1 refills | Status: AC

## 2023-04-01 MED ORDER — METRONIDAZOLE 500 MG PO TABS: 2000.0000 mg | ORAL_TABLET | Freq: Once | ORAL | 0 refills | Status: AC

## 2023-04-01 MED ORDER — PROMETHAZINE-DM 6.25-15 MG/5ML PO SYRP: 5.0000 mL | ORAL_SOLUTION | Freq: Four times a day (QID) | ORAL | 0 refills | Status: AC | PRN

## 2023-04-01 NOTE — Progress Notes (Signed)
E-Visit for Urinary Problems  Based on what you shared with me, I feel your condition warrants further evaluation and I recommend that you be seen for a face to face office visit.  Male bladder infections are not very common.  We worry about prostate or kidney conditions.  The standard of care is to examine the abdomen and kidneys, and to do a urine and blood test to make sure that something more serious is not going on.  We recommend that you see a provider today.  If your doctor's office is closed [facility] has the following Urgent Cares:    NOTE: You will not be charged for this e-visit.  If you are having a true medical emergency please call 911.       For an urgent face to face visit, [facility] has six urgent care centers for your convenience:     [facility] [place] Get Driving Directions [phone] [address] [place], [place] [address]    [facility] ([place]) Get Driving Directions [phone] [address] [place], [place] [address]  [facility]) Get Driving Directions [phone] [address] [place],  [place]  [address]  [facility] Get Driving Directions [phone] [address] [place], [place] [address]   [facility] [place] Get Driving Directions  [phone] [address] [place], [place] [address]   [facility] [place] Get Driving Directions [phone] [address] [place], [place] [address]  Your MyChart E-visit questionnaire answers were reviewed by a board certified advanced clinical practitioner to complete your personal care plan based on your specific symptoms.  Thank you for using e-Visits.     I have spent 5 minutes in review of e-visit questionnaire, review and updating patient chart, medical  decision making and response to patient.   Jennifer M Burnette, PA-C  

## 2023-04-01 NOTE — Discharge Instructions (Addendum)
Your strep was negative.  I suspect you have a virus.  We will contact you if you are positive for COVID.  Use Promethazine DM for cough.  This will make you sleepy so do not drive or drink alcohol while taking it.  Use over-the-counter medication including Mucinex, Flonase, Tylenol.  Make sure that you rest and drink plenty of fluid.  If your symptoms or not improving within a week please return for reevaluation.  If anything worsens be seen immediately.  Take metronidazole 2000 mg one-time.  Do not drink for 3 days after taking the medication as it can cause you to vomit.  If we need to arrange additional treatment we will let you know.  All partners need to be tested and treated as well.  Make sure you use a condom with each sexual encounter.  If you develop any additional symptoms please return for reevaluation.  Your blood pressure is very elevated.  Start amlodipine daily.  Someone should reach out to you to find a primary care.  Avoid decongestants, caffeine, sodium, NSAIDs.  If you develop any chest pain, shortness of breath, headache, vision change, dizziness in the setting of high blood pressure you need to be seen immediately.

## 2023-04-01 NOTE — ED Provider Notes (Signed)
[facility]    CSN: [id number] Arrival date & time: 04/01/23  4401      History   Chief Complaint Chief Complaint  Patient presents with   Cough    HPI Brian Poole is a 32 y.o. male.   Patient presents today with several concerns.  His primary concern today is a 4-day history of URI symptoms including cough, sore throat, night sweats, chills, rhinorrhea.  Denies any chest pain, shortness of breath, measurable fever, GI symptoms.  Denies any known sick contacts.  Has tried Zicam without improvement of symptoms.  Has had COVID several years ago.  Has had COVID vaccines.  Denies any recent antibiotics or steroids.  Denies any significant past medical history including allergies, asthma, COPD.  He is a former smoker but quit many years ago.  In addition, he reports a weeklong history of penile discomfort after duration.  Reports that he was exposed to trichomonas.  He denies any additional exposures.  Denies any recent antibiotic use.  He is interested in complete STI panel.  Blood pressure is elevated today.  He denies any chest pain, shortness of breath, headache, vision change.  He has had elevated blood pressure readings in the past but denies formal diagnosis of hypertension and has never taken antihypertensive medication.  Denies any increased NSAIDs, caffeine, sodium, decongestants.  He does not currently have a primary care provider.    History reviewed. No pertinent past medical history.  Patient Active Problem List   Diagnosis Date Noted   Nicotine addiction 11/16/2012    History reviewed. No pertinent surgical history.     Home Medications    Prior to Admission medications   Medication Sig Start Date End Date Taking? Authorizing Provider  amLODipine (NORVASC) 10 MG tablet Take 1 tablet (10 mg total) by mouth daily. 04/01/23  Yes Ketzia Guzek K, PA-C  metroNIDAZOLE (FLAGYL) 500 MG tablet Take 4 tablets (2,000 mg total) by mouth once for 1 dose. 04/01/23  04/01/23 Yes Alyssamae Klinck K, PA-C  promethazine-dextromethorphan (PROMETHAZINE-DM) 6.25-15 MG/5ML syrup Take 5 mLs by mouth 4 (four) times daily as needed for cough. 04/01/23  Yes Todd Jelinski, Noberto Retort, PA-C    Family History Family History  Problem Relation Age of Onset   Hypertension Mother    Diabetes Mother    Healthy Father     Social History Social History   Tobacco Use   Smoking status: Former    Current packs/day: 0.00    Types: Cigarettes    Quit date: 04/25/2018    Years since quitting: 4.9   Smokeless tobacco: Never  Vaping Use   Vaping status: Never Used  Substance Use Topics   Alcohol use: Yes   Drug use: No     Allergies   Patient has no known allergies.   Review of Systems Review of Systems  Constitutional:  Positive for activity change. Negative for appetite change, fatigue and fever.  HENT:  Positive for congestion and sore throat. Negative for sinus pressure and sneezing.   Respiratory:  Positive for cough. Negative for shortness of breath.   Cardiovascular:  Negative for chest pain.  Gastrointestinal:  Negative for abdominal pain, diarrhea, nausea and vomiting.  Genitourinary:  Positive for dysuria. Negative for frequency, genital sores, penile pain, penile swelling and testicular pain.  Neurological:  Negative for dizziness, light-headedness and headaches.     Physical Exam Triage Vital Signs ED Triage Vitals [04/01/23 0951]  Encounter Vitals Group     BP Marland Kitchen)  181/101     Systolic BP Percentile      Diastolic BP Percentile      Pulse Rate 63     Resp 16     Temp 98.3 F (36.8 C)     Temp Source Oral     SpO2 98 %     Weight 180 lb (81.6 kg)     Height 5\' 10"  (1.778 m)     Head Circumference      Peak Flow      Pain Score 4     Pain Loc      Pain Education      Exclude from Growth Chart    No data found.  Updated Vital Signs BP (!) 176/133 (BP Location: Left Arm)   Pulse 63   Temp 98.3 F (36.8 C) (Oral)   Resp 16   Ht 5\' 10"  (1.778  m)   Wt 180 lb (81.6 kg)   SpO2 98%   BMI 25.83 kg/m   Visual Acuity Right Eye Distance:   Left Eye Distance:   Bilateral Distance:    Right Eye Near:   Left Eye Near:    Bilateral Near:     Physical Exam Vitals reviewed.  Constitutional:      General: He is awake.     Appearance: Normal appearance. He is well-developed. He is not ill-appearing.     Comments: Very pleasant male appears stated age in no acute distress sitting comfortably in exam room  HENT:     Head: Normocephalic and atraumatic.     Right Ear: Tympanic membrane, ear canal and external ear normal. Tympanic membrane is not erythematous or bulging.     Left Ear: Tympanic membrane, ear canal and external ear normal. Tympanic membrane is not erythematous or bulging.     Nose: Nose normal.     Mouth/Throat:     Pharynx: Uvula midline. Posterior oropharyngeal erythema present. No oropharyngeal exudate.  Cardiovascular:     Rate and Rhythm: Normal rate and regular rhythm.     Heart sounds: Normal heart sounds, S1 normal and S2 normal. No murmur heard. Pulmonary:     Effort: Pulmonary effort is normal. No accessory muscle usage or respiratory distress.     Breath sounds: Normal breath sounds. No stridor. No wheezing, rhonchi or rales.     Comments: Clear to auscultation bilaterally Neurological:     Mental Status: He is alert.  Psychiatric:        Behavior: Behavior is cooperative.      UC Treatments / Results  Labs (all labs ordered are listed, but only abnormal results are displayed) Labs Reviewed  SARS CORONAVIRUS 2 (TAT 6-24 HRS)  RPR  HIV ANTIBODY (ROUTINE TESTING W REFLEX)  HEPATITIS C ANTIBODY  POCT RAPID STREP A (OFFICE)  CYTOLOGY, (ORAL, ANAL, URETHRAL) ANCILLARY ONLY    EKG   Radiology No results found.  Procedures Procedures (including critical care time)  Medications Ordered in UC Medications - No data to display  Initial Impression / Assessment and Plan / UC Course  I have  reviewed the triage vital signs and the nursing notes.  Pertinent labs & imaging results that were available during my care of the patient were reviewed by me and considered in my medical decision making (see chart for details).     Patient is well-appearing, afebrile, nontoxic, nontachycardic.  Strep testing was obtained given severity of sore throat and this was negative.  Suspect viral etiology and COVID testing was  obtained and is pending.  No evidence of acute infection on physical exam that warrant initiation of antibiotics.  He is young and otherwise healthy with the exception of history of hypertension.  I do not think he is likely to benefit greatly from antiviral therapy so we will defer this if he is positive.  Will treat symptomatically and Promethazine DM was sent to the pharmacy.  We discussed that this can be sedating so you should not drive or drink alcohol while taking it.  He is to use over-the-counter medications including Mucinex, Flonase, Tylenol for additional symptom relief.  Recommended nasal saline and sinus rinses.  If his symptoms or not improving within a week he is to return for reevaluation.  If he has any worsening symptoms he needs to be seen immediately.  Strict return precautions given.  Work excuse note provided.  STI testing was obtained today and is pending.  Given known exposure and clinical presentation will treat empirically for trichomonas.  Metronidazole 2000 mg one-time sent to pharmacy.  Discussed that he is not to drink any alcohol for 3 days after completing course of medication to prevent Antabuse side effects.  We will contact him if need to arrange any additional treatment.  Discussed the importance of safe sex practices.  If he develops any additional symptoms he is to return for reevaluation.  Blood pressure is very elevated.  He has a history of elevated blood pressure readings at his last several visits.  Will start amlodipine 10 mg daily.  He was  encouraged to avoid decongestants, caffeine, sodium, NSAIDs.  He denies any signs/symptoms of endorgan damage today but we discussed that if he develops any of the symptoms he needs to be seen immediately.  He is not currently have a primary care so we will try to establish him with someone via PCP assistance.  Discussed that if he has worsening or changing symptoms he needs to be seen immediately.    Final Clinical Impressions(s) / UC Diagnoses   Final diagnoses:  Upper respiratory tract infection, unspecified type  Acute cough  Penile irritation  Exposure to trichomonas  Elevated blood pressure reading in office with diagnosis of hypertension     Discharge Instructions      Your strep was negative.  I suspect you have a virus.  We will contact you if you are positive for COVID.  Use Promethazine DM for cough.  This will make you sleepy so do not drive or drink alcohol while taking it.  Use over-the-counter medication including Mucinex, Flonase, Tylenol.  Make sure that you rest and drink plenty of fluid.  If your symptoms or not improving within a week please return for reevaluation.  If anything worsens be seen immediately.  Take metronidazole 2000 mg one-time.  Do not drink for 3 days after taking the medication as it can cause you to vomit.  If we need to arrange additional treatment we will let you know.  All partners need to be tested and treated as well.  Make sure you use a condom with each sexual encounter.  If you develop any additional symptoms please return for reevaluation.  Your blood pressure is very elevated.  Start amlodipine daily.  Someone should reach out to you to find a primary care.  Avoid decongestants, caffeine, sodium, NSAIDs.  If you develop any chest pain, shortness of breath, headache, vision change, dizziness in the setting of high blood pressure you need to be seen immediately.  ED Prescriptions     Medication Sig Dispense Auth. Provider   amLODipine  (NORVASC) 10 MG tablet Take 1 tablet (10 mg total) by mouth daily. 30 tablet Javoni Lucken K, PA-C   metroNIDAZOLE (FLAGYL) 500 MG tablet Take 4 tablets (2,000 mg total) by mouth once for 1 dose. 4 tablet Jeno Calleros K, PA-C   promethazine-dextromethorphan (PROMETHAZINE-DM) 6.25-15 MG/5ML syrup Take 5 mLs by mouth 4 (four) times daily as needed for cough. 118 mL Khadeem Rockett K, PA-C      PDMP not reviewed this encounter.   Jeani Hawking, PA-C 04/01/23 1121

## 2023-04-01 NOTE — ED Triage Notes (Signed)
Patient here today with c/o cough, ST, night sweats, and stuffy nose X 4-5 days. He has been taking Zicam with some relief. No sick contacts. No recent travel.   Patient would also like to have STD testing after being told by his partner that she was positive for Trich. He has noticed that he has been having some pain at the end of urination X 1 week.

## 2023-04-02 LAB — SARS CORONAVIRUS 2 (TAT 6-24 HRS): SARS Coronavirus 2: NEGATIVE

## 2023-04-04 LAB — CYTOLOGY, (ORAL, ANAL, URETHRAL) ANCILLARY ONLY
Chlamydia: POSITIVE — AB
Comment: NEGATIVE
Comment: NEGATIVE
Comment: NORMAL
Neisseria Gonorrhea: NEGATIVE
Trichomonas: POSITIVE — AB

## 2023-04-05 ENCOUNTER — Telehealth (HOSPITAL_COMMUNITY): Payer: Self-pay | Admitting: Emergency Medicine

## 2023-04-05 MED ORDER — DOXYCYCLINE HYCLATE 100 MG PO CAPS: 100.0000 mg | ORAL_CAPSULE | Freq: Two times a day (BID) | ORAL | 0 refills | Status: AC

## 2023-04-05 NOTE — Telephone Encounter (Signed)
Patient treated at visit with Metronidazole Per protocol, patient will also need treatment with Doxycycline Results seen on mychart Attempted to reach patient x 1, LVM Prescription sent to pharmacy on file [facility] notified

## 2023-04-08 ENCOUNTER — Encounter (HOSPITAL_COMMUNITY): Payer: Self-pay

## 2023-05-31 ENCOUNTER — Telehealth: Payer: 59 | Admitting: Physician Assistant

## 2023-05-31 ENCOUNTER — Telehealth: Payer: 59 | Admitting: Emergency Medicine

## 2023-05-31 DIAGNOSIS — A599 Trichomoniasis, unspecified: Secondary | ICD-10-CM | POA: Diagnosis not present

## 2023-05-31 DIAGNOSIS — A64 Unspecified sexually transmitted disease: Secondary | ICD-10-CM

## 2023-05-31 DIAGNOSIS — A749 Chlamydial infection, unspecified: Secondary | ICD-10-CM

## 2023-05-31 MED ORDER — DOXYCYCLINE HYCLATE 100 MG PO TABS: 100.0000 mg | ORAL_TABLET | Freq: Two times a day (BID) | ORAL | 0 refills | Status: AC

## 2023-05-31 MED ORDER — METRONIDAZOLE 500 MG PO TABS: 2000.0000 mg | ORAL_TABLET | Freq: Once | ORAL | 0 refills | Status: AC

## 2023-05-31 NOTE — Progress Notes (Signed)
Virtual Visit Consent   Brian Poole, you are scheduled for a virtual visit with a [facility] provider today. Just as with appointments in the office, your consent must be obtained to participate. Your consent will be active for this visit and any virtual visit you may have with one of our providers in the next 365 days. If you have a MyChart account, a copy of this consent can be sent to you electronically.  As this is a virtual visit, video technology does not allow for your provider to perform a traditional examination. This may limit your provider's ability to fully assess your condition. If your provider identifies any concerns that need to be evaluated in person or the need to arrange testing (such as labs, EKG, etc.), we will make arrangements to do so. Although advances in technology are sophisticated, we cannot ensure that it will always work on either your end or our end. If the connection with a video visit is poor, the visit may have to be switched to a telephone visit. With either a video or telephone visit, we are not always able to ensure that we have a secure connection.  By engaging in this virtual visit, you consent to the provision of healthcare and authorize for your insurance to be billed (if applicable) for the services provided during this visit. Depending on your insurance coverage, you may receive a charge related to this service.  I need to obtain your verbal consent now. Are you willing to proceed with your visit today? KEYSHAUN HOFACKER has provided verbal consent on 05/31/2023 for a virtual visit (video or telephone). Cathlyn Parsons, NP  Date: 05/31/2023 1:46 PM  Virtual Visit via Video Note   I, Cathlyn Parsons, connected with  Brian Poole  ([id number], 12-04-1990) on 05/31/23 at  1:45 PM EDT by a video-enabled telemedicine application and verified that I am speaking with the correct person using two identifiers.  Location: Patient: Virtual Visit Location Patient:  Other: work Provider: Pharmacist, community: Home Office   I discussed the limitations of evaluation and management by telemedicine and the availability of in person appointments. The patient expressed understanding and agreed to proceed.    History of Present Illness: Brian Poole is a 32 y.o. who identifies as a male who was assigned male at birth, and is being seen today for STI treatment. Was tested in August and positive for chlamydia and trich and was rx appropriate medicine - states he picked up the meds but never took them and now can't find them. Does still have penile discharge. Girlfriend was tested for STIs and is positive for trich and chlamydia, now pt wants new rx. Denies abd pain or dysuria. Denies genital lesions  HPI: HPI  Problems:  Patient Active Problem List   Diagnosis Date Noted   Nicotine addiction 11/16/2012    Allergies: No Known Allergies Medications:  Current Outpatient Medications:    doxycycline (VIBRA-TABS) 100 MG tablet, Take 1 tablet (100 mg total) by mouth 2 (two) times daily., Disp: 14 tablet, Rfl: 0   metroNIDAZOLE (FLAGYL) 500 MG tablet, Take 4 tablets (2,000 mg total) by mouth once for 1 dose., Disp: 4 tablet, Rfl: 0   amLODipine (NORVASC) 10 MG tablet, Take 1 tablet (10 mg total) by mouth daily., Disp: 30 tablet, Rfl: 1   promethazine-dextromethorphan (PROMETHAZINE-DM) 6.25-15 MG/5ML syrup, Take 5 mLs by mouth 4 (four) times daily as needed for cough., Disp: 118 mL, Rfl: 0  Observations/Objective: Patient is well-developed, well-nourished in no acute distress.  Resting comfortably  at work.  Head is normocephalic, atraumatic.  No labored breathing.  Speech is clear and coherent with logical content.  Patient is alert and oriented at baseline.    Assessment and Plan: 1. Chlamydia  2. Trichomoniasis  Rx antibiotics as requested.   Follow Up Instructions: I discussed the assessment and treatment plan with the patient. The patient  was provided an opportunity to ask questions and all were answered. The patient agreed with the plan and demonstrated an understanding of the instructions.  A copy of instructions were sent to the patient via MyChart unless otherwise noted below.   The patient was advised to call back or seek an in-person evaluation if the symptoms worsen or if the condition fails to improve as anticipated.    Cathlyn Parsons, NP

## 2023-05-31 NOTE — Progress Notes (Signed)
Because we cannot treat sexually transmitted infections via e-visit alone, I feel your condition warrants further evaluation and I recommend that you be seen in a face to face visit. We would need you to be evaluated at one of our in-person locations for updated testing and further treatment.    NOTE: There will be NO CHARGE for this eVisit   If you are having a true medical emergency please call 911.      For an urgent face to face visit, [facility] has eight urgent care centers for your convenience:   NEW!! [facility] at [facility] Get Driving Directions [phone] [address], [address] [place], [address]    [facility] at [facility] Get Driving Directions [phone] [address] [place], [place] [address]   [facility]) Get Driving Directions [phone] [address] [place], [place] [address]  [facility] [place]) Get Driving Directions [phone] [address] [place],  [place]  [address]  [facility] - at [employer]  [phone] [address] [place],  [place] [address]   [facility] at [facility] Get Driving Directions [phone] [address] [place], [place] [address]   [facility] at [facility] Get Driving Directions  [phone] [address] [place], [place] [address]   [facility] Health Urgent Care at [facility] Directions [phone] [address], [address] [place], [place] [address]  Your MyChart E-visit questionnaire answers were reviewed by a board certified advanced clinical practitioner to complete your personal care plan based on your specific symptoms.  Thank you for using e-Visits.

## 2023-05-31 NOTE — Patient Instructions (Signed)
  Brian Poole, thank you for joining Cathlyn Parsons, NP for today's virtual visit.  While this provider is not your primary care provider (PCP), if your PCP is located in our provider database this encounter information will be shared with them immediately following your visit.   A [facility] MyChart account gives you access to today's visit and all your visits, tests, and labs performed at [facility] " click here if you don't have a [facility] MyChart account or go to [email or web address]  Consent: (Patient) Brian Poole provided verbal consent for this virtual visit at the beginning of the encounter.  Current Medications:  Current Outpatient Medications:    doxycycline (VIBRA-TABS) 100 MG tablet, Take 1 tablet (100 mg total) by mouth 2 (two) times daily., Disp: 14 tablet, Rfl: 0   metroNIDAZOLE (FLAGYL) 500 MG tablet, Take 4 tablets (2,000 mg total) by mouth once for 1 dose., Disp: 4 tablet, Rfl: 0   amLODipine (NORVASC) 10 MG tablet, Take 1 tablet (10 mg total) by mouth daily., Disp: 30 tablet, Rfl: 1   promethazine-dextromethorphan (PROMETHAZINE-DM) 6.25-15 MG/5ML syrup, Take 5 mLs by mouth 4 (four) times daily as needed for cough., Disp: 118 mL, Rfl: 0   Medications ordered in this encounter:  Meds ordered this encounter  Medications   doxycycline (VIBRA-TABS) 100 MG tablet    Sig: Take 1 tablet (100 mg total) by mouth 2 (two) times daily.    Dispense:  14 tablet    Refill:  0   metroNIDAZOLE (FLAGYL) 500 MG tablet    Sig: Take 4 tablets (2,000 mg total) by mouth once for 1 dose.    Dispense:  4 tablet    Refill:  0     *If you need refills on other medications prior to your next appointment, please contact your pharmacy*  Follow-Up: Call back or seek an in-person evaluation if the symptoms worsen or if the condition fails to improve as anticipated.  [facility] [phone]  Other Instructions Use condoms for at least a week, until  treatment is complete, or you and your partner will just pass the infections back and forth to each other.   If this treatment doesn't work and you still have symptoms, you will need to be seen in person and retested.    If you have been instructed to have an in-person evaluation today at a local Urgent Care facility, please use the link below. It will take you to a list of all of our available [facility], including address, phone number and hours of operation. Please do not delay care.  [facility]  If you or a family member do not have a primary care provider, use the link below to schedule a visit and establish care. When you choose a [facility] primary care physician or advanced practice provider, you gain a long-term partner in health. Find a Primary Care Provider  Learn more about [facility]'s in-office and virtual care options: [facility] - Get Care Now
